# Patient Record
Sex: Male | Born: 2000 | Hispanic: No | Marital: Single
Health system: Southern US, Community
[De-identification: ages and names within clinical notes are randomized; demographics above are authoritative.]

---
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
## 2020-02-22 DIAGNOSIS — M542 Cervicalgia: Secondary | ICD-10-CM | POA: Diagnosis not present

## 2020-02-22 DIAGNOSIS — M25512 Pain in left shoulder: Secondary | ICD-10-CM | POA: Diagnosis not present

## 2020-03-06 DIAGNOSIS — M542 Cervicalgia: Secondary | ICD-10-CM | POA: Diagnosis not present

## 2020-03-13 DIAGNOSIS — M542 Cervicalgia: Secondary | ICD-10-CM | POA: Diagnosis not present

## 2020-06-15 DIAGNOSIS — Z23 Encounter for immunization: Secondary | ICD-10-CM | POA: Diagnosis not present

## 2020-08-08 DIAGNOSIS — Z20822 Contact with and (suspected) exposure to covid-19: Secondary | ICD-10-CM | POA: Diagnosis not present

## 2020-08-21 ENCOUNTER — Other Ambulatory Visit: Payer: Self-pay

## 2020-08-23 ENCOUNTER — Other Ambulatory Visit: Payer: Self-pay

## 2020-08-23 DIAGNOSIS — Z20822 Contact with and (suspected) exposure to covid-19: Secondary | ICD-10-CM

## 2020-08-24 LAB — NOVEL CORONAVIRUS, NAA: SARS-CoV-2, NAA: NOT DETECTED

## 2020-08-24 LAB — SARS-COV-2, NAA 2 DAY TAT

## 2021-09-27 DIAGNOSIS — Z0000 Encounter for general adult medical examination without abnormal findings: Secondary | ICD-10-CM | POA: Diagnosis not present

## 2021-09-27 DIAGNOSIS — N62 Hypertrophy of breast: Secondary | ICD-10-CM | POA: Diagnosis not present

## 2021-10-04 DIAGNOSIS — M79641 Pain in right hand: Secondary | ICD-10-CM | POA: Diagnosis not present

## 2021-10-04 DIAGNOSIS — M7989 Other specified soft tissue disorders: Secondary | ICD-10-CM | POA: Diagnosis not present

## 2021-10-04 DIAGNOSIS — M79644 Pain in right finger(s): Secondary | ICD-10-CM | POA: Diagnosis not present

## 2021-10-04 DIAGNOSIS — W098XXA Fall on or from other playground equipment, initial encounter: Secondary | ICD-10-CM | POA: Diagnosis not present

## 2021-10-04 DIAGNOSIS — S62326A Displaced fracture of shaft of fifth metacarpal bone, right hand, initial encounter for closed fracture: Secondary | ICD-10-CM | POA: Diagnosis not present

## 2021-10-04 DIAGNOSIS — W109XXA Fall (on) (from) unspecified stairs and steps, initial encounter: Secondary | ICD-10-CM | POA: Diagnosis not present

## 2021-10-07 DIAGNOSIS — S62306A Unspecified fracture of fifth metacarpal bone, right hand, initial encounter for closed fracture: Secondary | ICD-10-CM | POA: Diagnosis not present

## 2021-10-07 DIAGNOSIS — M79641 Pain in right hand: Secondary | ICD-10-CM | POA: Diagnosis not present

## 2021-10-17 DIAGNOSIS — M79641 Pain in right hand: Secondary | ICD-10-CM | POA: Diagnosis not present

## 2021-10-17 DIAGNOSIS — S62306D Unspecified fracture of fifth metacarpal bone, right hand, subsequent encounter for fracture with routine healing: Secondary | ICD-10-CM | POA: Diagnosis not present

## 2021-11-07 DIAGNOSIS — M79641 Pain in right hand: Secondary | ICD-10-CM | POA: Diagnosis not present

## 2021-11-07 DIAGNOSIS — S62306D Unspecified fracture of fifth metacarpal bone, right hand, subsequent encounter for fracture with routine healing: Secondary | ICD-10-CM | POA: Diagnosis not present

## 2021-12-11 DIAGNOSIS — M79641 Pain in right hand: Secondary | ICD-10-CM | POA: Diagnosis not present

## 2021-12-11 DIAGNOSIS — S62306D Unspecified fracture of fifth metacarpal bone, right hand, subsequent encounter for fracture with routine healing: Secondary | ICD-10-CM | POA: Diagnosis not present

## 2022-02-12 DIAGNOSIS — M79641 Pain in right hand: Secondary | ICD-10-CM | POA: Diagnosis not present

## 2022-02-12 DIAGNOSIS — S62306D Unspecified fracture of fifth metacarpal bone, right hand, subsequent encounter for fracture with routine healing: Secondary | ICD-10-CM | POA: Diagnosis not present

## 2022-06-12 DIAGNOSIS — S91209A Unspecified open wound of unspecified toe(s) with damage to nail, initial encounter: Secondary | ICD-10-CM | POA: Diagnosis not present

## 2023-01-21 DIAGNOSIS — L600 Ingrowing nail: Secondary | ICD-10-CM | POA: Diagnosis not present

## 2023-01-21 DIAGNOSIS — M792 Neuralgia and neuritis, unspecified: Secondary | ICD-10-CM | POA: Diagnosis not present

## 2023-01-22 ENCOUNTER — Telehealth: Payer: Self-pay | Admitting: Podiatry

## 2023-01-22 NOTE — Telephone Encounter (Signed)
Patient called requesting an appointment for an ingrown toenail.  Please reach out to patient to schedule.  I didn't see that he had an appointment yet.

## 2023-02-04 DIAGNOSIS — M792 Neuralgia and neuritis, unspecified: Secondary | ICD-10-CM | POA: Diagnosis not present
